# Patient Record
Sex: Female | Born: 1965 | Race: White | Hispanic: No | Marital: Married | Smoking: Heavy tobacco smoker
Health system: Southern US, Community
[De-identification: ages and names within clinical notes are randomized; demographics above are authoritative.]

## PROBLEM LIST (undated history)

## (undated) HISTORY — PX: TUBAL LIGATION: SHX77

## (undated) HISTORY — PX: KNEE SURGERY: SHX244

---

## 2002-04-13 ENCOUNTER — Ambulatory Visit (HOSPITAL_COMMUNITY): Admission: RE | Admit: 2002-04-13 | Discharge: 2002-04-13 | Payer: Self-pay | Admitting: Family Medicine

## 2002-04-13 ENCOUNTER — Encounter: Payer: Self-pay | Admitting: Family Medicine

## 2003-12-04 ENCOUNTER — Emergency Department (HOSPITAL_COMMUNITY): Admission: EM | Admit: 2003-12-04 | Discharge: 2003-12-04 | Payer: Self-pay | Admitting: Emergency Medicine

## 2004-01-14 ENCOUNTER — Ambulatory Visit (HOSPITAL_COMMUNITY): Admission: RE | Admit: 2004-01-14 | Discharge: 2004-01-14 | Payer: Self-pay | Admitting: Family Medicine

## 2004-01-18 ENCOUNTER — Ambulatory Visit (HOSPITAL_COMMUNITY): Admission: RE | Admit: 2004-01-18 | Discharge: 2004-01-18 | Payer: Self-pay | Admitting: Family Medicine

## 2004-02-09 ENCOUNTER — Encounter (HOSPITAL_COMMUNITY): Admission: RE | Admit: 2004-02-09 | Discharge: 2004-03-10 | Payer: Self-pay | Admitting: Neurosurgery

## 2004-03-15 ENCOUNTER — Ambulatory Visit (HOSPITAL_COMMUNITY): Admission: RE | Admit: 2004-03-15 | Discharge: 2004-03-15 | Payer: Self-pay | Admitting: Neurosurgery

## 2004-05-03 ENCOUNTER — Ambulatory Visit (HOSPITAL_COMMUNITY): Admission: RE | Admit: 2004-05-03 | Discharge: 2004-05-03 | Payer: Self-pay | Admitting: Family Medicine

## 2004-08-14 ENCOUNTER — Emergency Department (HOSPITAL_COMMUNITY): Admission: EM | Admit: 2004-08-14 | Discharge: 2004-08-14 | Payer: Self-pay | Admitting: Emergency Medicine

## 2004-11-20 ENCOUNTER — Ambulatory Visit (HOSPITAL_COMMUNITY): Admission: RE | Admit: 2004-11-20 | Discharge: 2004-11-20 | Payer: Self-pay | Admitting: Family Medicine

## 2005-02-07 ENCOUNTER — Ambulatory Visit (HOSPITAL_COMMUNITY): Admission: RE | Admit: 2005-02-07 | Discharge: 2005-02-07 | Payer: Self-pay | Admitting: Obstetrics and Gynecology

## 2005-04-20 ENCOUNTER — Emergency Department (HOSPITAL_COMMUNITY): Admission: EM | Admit: 2005-04-20 | Discharge: 2005-04-20 | Payer: Self-pay | Admitting: Emergency Medicine

## 2005-07-18 ENCOUNTER — Ambulatory Visit (HOSPITAL_COMMUNITY): Admission: RE | Admit: 2005-07-18 | Discharge: 2005-07-18 | Payer: Self-pay | Admitting: Family Medicine

## 2007-03-04 ENCOUNTER — Ambulatory Visit (HOSPITAL_COMMUNITY): Admission: RE | Admit: 2007-03-04 | Discharge: 2007-03-04 | Payer: Self-pay | Admitting: Family Medicine

## 2007-03-12 ENCOUNTER — Ambulatory Visit (HOSPITAL_COMMUNITY): Admission: RE | Admit: 2007-03-12 | Discharge: 2007-03-12 | Payer: Self-pay | Admitting: Family Medicine

## 2008-04-21 ENCOUNTER — Ambulatory Visit (HOSPITAL_COMMUNITY): Admission: RE | Admit: 2008-04-21 | Discharge: 2008-04-21 | Payer: Self-pay | Admitting: Family Medicine

## 2018-11-28 ENCOUNTER — Emergency Department (HOSPITAL_COMMUNITY)
Admission: EM | Admit: 2018-11-28 | Discharge: 2018-11-28 | Disposition: A | Discharge status: Home / Self Care | Payer: Worker's Compensation | Attending: Emergency Medicine | Admitting: Emergency Medicine

## 2018-11-28 ENCOUNTER — Other Ambulatory Visit: Payer: Self-pay

## 2018-11-28 ENCOUNTER — Emergency Department (HOSPITAL_COMMUNITY): Payer: Worker's Compensation

## 2018-11-28 ENCOUNTER — Encounter (HOSPITAL_COMMUNITY): Payer: Self-pay | Admitting: Emergency Medicine

## 2018-11-28 DIAGNOSIS — Y93G9 Activity, other involving cooking and grilling: Secondary | ICD-10-CM | POA: Insufficient documentation

## 2018-11-28 DIAGNOSIS — Y990 Civilian activity done for income or pay: Secondary | ICD-10-CM | POA: Diagnosis not present

## 2018-11-28 DIAGNOSIS — Y929 Unspecified place or not applicable: Secondary | ICD-10-CM | POA: Diagnosis not present

## 2018-11-28 DIAGNOSIS — Z23 Encounter for immunization: Secondary | ICD-10-CM | POA: Diagnosis not present

## 2018-11-28 DIAGNOSIS — S61215A Laceration without foreign body of left ring finger without damage to nail, initial encounter: Secondary | ICD-10-CM | POA: Insufficient documentation

## 2018-11-28 DIAGNOSIS — F17210 Nicotine dependence, cigarettes, uncomplicated: Secondary | ICD-10-CM | POA: Insufficient documentation

## 2018-11-28 DIAGNOSIS — S61212A Laceration without foreign body of right middle finger without damage to nail, initial encounter: Secondary | ICD-10-CM

## 2018-11-28 DIAGNOSIS — W268XXA Contact with other sharp object(s), not elsewhere classified, initial encounter: Secondary | ICD-10-CM | POA: Diagnosis not present

## 2018-11-28 MED ORDER — CEPHALEXIN 500 MG PO CAPS: 500.0000 mg | ORAL_CAPSULE | Freq: Four times a day (QID) | ORAL | 0 refills | Status: AC

## 2018-11-28 MED ORDER — LIDOCAINE HCL (PF) 1 % IJ SOLN
5.0000 mL | Freq: Once | INTRAMUSCULAR | Status: AC
  Administered 2018-11-28: 5 mL via INTRADERMAL
  Filled 2018-11-28: qty 6

## 2018-11-28 MED ORDER — TETANUS-DIPHTH-ACELL PERTUSSIS 5-2.5-18.5 LF-MCG/0.5 IM SUSP
0.5000 mL | Freq: Once | INTRAMUSCULAR | Status: AC
  Administered 2018-11-28: 0.5 mL via INTRAMUSCULAR
  Filled 2018-11-28: qty 0.5

## 2018-11-28 NOTE — Discharge Instructions (Signed)
You have been diagnosed today with laceration of the right middle and left ring finger.  At this time there does not appear to be the presence of an emergent medical condition, however there is always the potential for conditions to change. Please read and follow the below instructions.  Please return to the Emergency Department immediately for any new or worsening symptoms. Please be sure to follow up with your Primary Care Provider in 1 week regarding your visit today; please call their office to schedule an appointment even if you are feeling better for a follow-up visit. Due to the placement of your lacerations you must have a recheck of your wounds in 3 days.  You may return to the emergency department or go to your primary care provider for a wound recheck.  Return to the emergency department sooner if you see signs of infection including redness, swelling, increased pain, pain with movement of the joint.  Please take the antibiotic Keflex as prescribed to avoid infection. Your sutures will need to be removed in 7 days.  You may have this done at the primary care doctor or here at the emergency department.   Return to the emergency department immediately if: You have a fever. You have chills. You have drainage, redness, swelling, or pain at your wound. There is a bad smell coming from your wound. The skin edges of your wound start to separate after your sutures have been removed. Your wound becomes thick, raised, and darker in color after your sutures come out (scarring).  Please read the additional information packets attached to your discharge summary.  Do not take your medicine if  develop an itchy rash, swelling in your mouth or lips, or difficulty breathing.

## 2018-11-28 NOTE — ED Provider Notes (Signed)
[facility] EMERGENCY DEPARTMENT Provider Note   CSN: [id number] Arrival date & time: 11/28/18  1113     History   Chief Complaint Chief Complaint  Patient presents with  . Laceration    HPI Madison Gregory is a 52 y.o. female presents today for laceration occurred just prior to arrival.  Patient states that she was at work attempting to pull a mixer out of the freezer when her hand slipped cutting herself on the blade.  Patient with lacerations to bilateral hands, right middle finger, left middle and fourth finger.  Patient states that she had a moderate intensity, sharp pain to the fingers that is worsened with movement and palpation.  Patient states that pain has somewhat subsided since onset.  Patient has not taken anything for pain prior to arrival.  Patient states that her last tetanus shot was greater than 5 years ago however is unsure of timing, requesting booster.  No other complaints.  HPI  History reviewed. No pertinent past medical history.  There are no active problems to display for this patient.   Past Surgical History:  Procedure Laterality Date  . KNEE SURGERY    . TUBAL LIGATION       OB History   None      Home Medications    Prior to Admission medications   Medication Sig Start Date End Date Taking? Authorizing Provider  cephALEXin (KEFLEX) 500 MG capsule Take 1 capsule (500 mg total) by mouth 4 (four) times daily for 7 days. 11/28/18 12/05/18  Bill Salinas, PA-C    Family History History reviewed. No pertinent family history.  Social History Social History   Tobacco Use  . Smoking status: Heavy Tobacco Smoker    Packs/day: 0.50    Types: Cigarettes  . Smokeless tobacco: Never Used  Substance Use Topics  . Alcohol use: Never    Frequency: Never  . Drug use: Never     Allergies   Patient has no allergy information on record.   Review of Systems Review of Systems  Constitutional: Negative.  Negative for chills and fever.    Skin: Positive for wound (Laceration).  Neurological: Negative.  Negative for weakness and numbness.   Physical Exam Updated Vital Signs BP 128/70 (BP Location: Right Arm)   Pulse 63   Temp 98.5 F (36.9 C)   Resp 16   Ht 5\' 1"  (1.549 m)   Wt 63.5 kg   SpO2 100%   BMI 26.45 kg/m   Physical Exam  Constitutional: She is oriented to person, place, and time. She appears well-developed and well-nourished. No distress.  HENT:  Head: Normocephalic and atraumatic.  Right Ear: External ear normal.  Left Ear: External ear normal.  Nose: Nose normal.  Eyes: Pupils are equal, round, and reactive to light. EOM are normal.  Neck: Trachea normal and normal range of motion. Neck supple. No tracheal deviation present.  Cardiovascular: Normal rate, regular rhythm, normal heart sounds and intact distal pulses.  Pulses:      Radial pulses are 2+ on the right side, and 2+ on the left side.  Pulmonary/Chest: Effort normal. No respiratory distress.  Abdominal: Soft. There is no tenderness. There is no rebound and no guarding.  Musculoskeletal: Normal range of motion.  Right hand: Laceration to the right long finger at the palmar side of the middle phalanx.  2 cm in length, some adipose tissue present without tendon injury, nerve injury or l major vascular involvement hand thoroughly inspected, all  other small lacerations present are very superficial and do not warrant suturing. Tenderness to palpation directly over laceration.  No snuffbox tenderness to palpation. No tenderness to palpation over flexor sheath.  Finger adduction/abduction intact with 5/5 strength with some increased pain.  Thumb opposition intact. Full active and resisted ROM to flexion/extension at wrist, MCP, PIP and DIP of all fingers.  FDS/FDP intact. Grip 5/5 strength.  Radial artery 2+ with <2sec cap refill in all fingers.  Sensation intact to light-tough in median/ulnar/radial  distributions.  ---------------------------  Left hand: Laceration present to the medial/palmar side of the left fourth finger along proximal joint line and proximal phalanx.  Thoroughly inspected and is without joint capsule, tendon, nerve or major vessel involvement.  The rest of the hand was thoroughly inspected and all other abrasions are very superficial and do not warrant suturing. Tenderness to palpation over directly over the laceration.  No snuffbox tenderness to palpation. No tenderness to palpation over flexor sheath.  Finger adduction/abduction intact with 5/5 strength.  Thumb opposition intact. Full active and resisted ROM to flexion/extension at wrist, MCP, PIP and DIP of all fingers.  FDS/FDP intact. Grip 5/5 strength.  Radial artery 2+ with <2sec cap refill in all fingers.  Sensation intact to light-tough in median/ulnar/radial distributions.  Neurological: She is alert and oriented to person, place, and time.  Skin: Skin is warm and dry. Capillary refill takes less than 2 seconds.  Psychiatric: She has a normal mood and affect. Her behavior is normal.           ED Treatments / Results  Labs (all labs ordered are listed, but only abnormal results are displayed) Labs Reviewed - No data to display  EKG None  Radiology Dg Hand Complete Left  Result Date: 11/28/2018 CLINICAL DATA:  Bilateral ring and left middle finger lacerations. EXAM: RIGHT HAND - COMPLETE 3+ VIEW; LEFT HAND - COMPLETE 3+ VIEW COMPARISON:  Right hand x-rays dated November 20, 2004. FINDINGS: Left hand: No acute fracture or dislocation. Mild third through fifth DIP joint space narrowing. Bone mineralization is normal. Soft tissues are unremarkable. No radiopaque foreign body identified. Right hand: No acute fracture or dislocation. Mild third through fifth DIP joint space narrowing. Bone mineralization is normal. Soft tissues are unremarkable. No radiopaque foreign body identified. IMPRESSION: 1. No  acute osseous abnormality.  No radiopaque foreign body. 2. 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 Electronically Signed   By: Obie Dredge M.D.   On: 11/28/2018 12:05   Dg Hand Complete Right  Result Date: 11/28/2018 CLINICAL DATA:  Bilateral ring and left middle finger lacerations. EXAM: RIGHT HAND - COMPLETE 3+ VIEW; LEFT HAND - COMPLETE 3+ VIEW COMPARISON:  Right hand x-rays dated November 20, 2004. FINDINGS: Left hand: No acute fracture or dislocation. Mild third through fifth DIP joint space narrowing. Bone mineralization is normal. Soft tissues are unremarkable. No radiopaque foreign body identified. Right hand: No acute fracture or dislocation. Mild third through fifth DIP joint space narrowing. Bone mineralization is normal. Soft tissues are unremarkable. No radiopaque foreign body identified. IMPRESSION: 1. No acute osseous abnormality.  No radiopaque foreign body. 2. 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 Electronically Signed   By: Obie Dredge M.D.   On: 11/28/2018 12:05    Procedures .Marland KitchenLaceration Repair Date/Time: 11/28/2018 1:41 PM Performed by: Bill Salinas, PA-C Authorized by: Bill Salinas, PA-C   Consent:    Consent obtained:  Verbal  Consent given by:  Patient   Risks discussed:  Infection, pain, retained foreign body, tendon damage, vascular damage, poor wound healing, poor cosmetic result, need for additional repair and nerve damage Anesthesia (see MAR for exact dosages):    Anesthesia method:  Local infiltration   Local anesthetic:  Lidocaine 1% w/o epi Laceration details:    Location:  Finger   Finger location:  L ring finger   Length (cm):  2   Depth (mm):  3 Repair type:    Repair type:  Simple Pre-procedure details:    Preparation:  Patient was prepped and draped in usual sterile fashion and imaging obtained to evaluate for foreign bodies Exploration:    Hemostasis achieved with:   Direct pressure   Wound exploration: wound explored through full range of motion and entire depth of wound probed and visualized     Wound extent: no foreign bodies/material noted, no muscle damage noted, no nerve damage noted, no tendon damage noted, no underlying fracture noted and no vascular damage noted   Treatment:    Area cleansed with:  Betadine   Amount of cleaning:  Extensive   Irrigation solution:  Sterile saline   Irrigation volume:  1 L   Irrigation method:  Pressure wash Skin repair:    Repair method:  Sutures   Suture size:  4-0   Suture material:  Prolene   Suture technique:  Simple interrupted   Number of sutures:  3 Approximation:    Approximation:  Close Post-procedure details:    Dressing:  Antibiotic ointment and non-adherent dressing   Patient tolerance of procedure:  Tolerated well, no immediate complications .Marland KitchenLaceration Repair Date/Time: 11/28/2018 1:42 PM Performed by: Bill Salinas, PA-C Authorized by: Bill Salinas, PA-C   Consent:    Consent obtained:  Verbal   Consent given by:  Patient   Risks discussed:  Infection, pain, retained foreign body, tendon damage, vascular damage, poor wound healing, poor cosmetic result, need for additional repair and nerve damage Anesthesia (see MAR for exact dosages):    Anesthesia method:  Local infiltration   Local anesthetic:  Lidocaine 1% w/o epi Laceration details:    Location:  Finger   Finger location:  R long finger   Length (cm):  2   Depth (mm):  3 Repair type:    Repair type:  Simple Pre-procedure details:    Preparation:  Patient was prepped and draped in usual sterile fashion and imaging obtained to evaluate for foreign bodies Exploration:    Hemostasis achieved with:  Direct pressure   Wound exploration: wound explored through full range of motion and entire depth of wound probed and visualized     Wound extent: no foreign bodies/material noted, no muscle damage noted, no nerve damage  noted, no tendon damage noted, no underlying fracture noted and no vascular damage noted     Contaminated: no   Treatment:    Area cleansed with:  Betadine and saline   Amount of cleaning:  Extensive   Irrigation solution:  Sterile saline   Irrigation volume:  1 L   Irrigation method:  Pressure wash Skin repair:    Repair method:  Sutures   Suture size:  4-0   Suture material:  Prolene   Suture technique:  Simple interrupted   Number of sutures:  3 Approximation:    Approximation:  Close Post-procedure details:    Dressing:  Antibiotic ointment and non-adherent dressing   Patient tolerance of procedure:  Tolerated well, no  immediate complications   (including critical care time)  Medications Ordered in ED Medications  Tdap (BOOSTRIX) injection 0.5 mL (0.5 mLs Intramuscular Given 11/28/18 1200)  lidocaine (PF) (XYLOCAINE) 1 % injection 5 mL (5 mLs Intradermal Given 11/28/18 1253)     Initial Impression / Assessment and Plan / ED Course  I have reviewed the triage vital signs and the nursing notes.  Pertinent labs & imaging results that were available during my care of the patient were reviewed by me and considered in my medical decision making (see chart for details).    Madison Gregory is a 52 y.o. female who presents to ED for laceration of right middle and left fourth fingers. Wound thoroughly cleaned in ED today. Wound explored and bottom of wound seen in a bloodless field, no tendon, nerve, joint or major vessel involvement.  Imaging today without foreign body or osseous involvement.  Lacerations repaired as dictated above. Patient counseled on home wound care.  Due to laceration location and that patient does not have a reliable outpatient PCP follow-up I have requested that the patient return to the emergency department in 72 hours for wound recheck.  Additionally I have prescribed patient Keflex for prophylaxis.  Tdap has been updated today. Patient was urged to return to the  Emergency Department for worsening pain, inability to move joint without pain, swelling, expanding erythema especially if it streaks away from the affected area, fever, or for any additional concerns. Patient verbalized understanding of return precautions. All questions answered.  Patient also informed that sutures will need to be removed in 1 week here at the emergency department.  At this time there does not appear to be any evidence of an acute emergency medical condition and the patient appears stable for discharge with appropriate outpatient follow up. Diagnosis was discussed with patient who verbalizes understanding of care plan and is agreeable to discharge. I have discussed return precautions with patient and son at bedside who verbalize understanding of return precautions. Patient strongly encouraged to return for wound check in 72 hours or sooner if new or worsening symptoms. All questions answered.  Patient was seen and evaluated by Dr. Adriana Simasook during this visit who agrees with work-up, laceration repair and follow-up.  Note: Portions of this report may have been transcribed using voice recognition software. Every effort was made to ensure accuracy; however, inadvertent computerized transcription errors may still be present. Final Clinical Impressions(s) / ED Diagnoses   Final diagnoses:  Laceration of right middle finger without foreign body without damage to nail, initial encounter  Laceration of left ring finger without foreign body without damage to nail, initial encounter    ED Discharge Orders         Ordered    cephALEXin (KEFLEX) 500 MG capsule  4 times daily     11/28/18 1402           Elizabeth PalauMorelli, Asiah Befort A, PA-C 11/28/18 1726    Donnetta Hutchingook, Brian, MD 12/01/18 1752

## 2018-11-28 NOTE — ED Triage Notes (Signed)
Pt reports was moving a mixer and freezer while at work. Laceration noted to bilateral ring finger and left middle finger. Minimal bleeding noted.

## 2018-12-02 ENCOUNTER — Other Ambulatory Visit: Payer: Self-pay

## 2018-12-02 ENCOUNTER — Emergency Department (HOSPITAL_COMMUNITY)
Admission: EM | Admit: 2018-12-02 | Discharge: 2018-12-02 | Disposition: A | Discharge status: Home / Self Care | Payer: Worker's Compensation | Attending: Emergency Medicine | Admitting: Emergency Medicine

## 2018-12-02 ENCOUNTER — Encounter (HOSPITAL_COMMUNITY): Payer: Self-pay | Admitting: Emergency Medicine

## 2018-12-02 DIAGNOSIS — Z5189 Encounter for other specified aftercare: Secondary | ICD-10-CM

## 2018-12-02 DIAGNOSIS — Z4801 Encounter for change or removal of surgical wound dressing: Secondary | ICD-10-CM | POA: Diagnosis present

## 2018-12-02 NOTE — ED Notes (Signed)
Patient given discharge instruction, verbalized understand. IV removed, band aid applied. Patient ambulatory out of the department.  

## 2018-12-02 NOTE — ED Provider Notes (Signed)
[facility] EMERGENCY DEPARTMENT Provider Note   CSN: [id number] Arrival date & time: 12/02/18  1411     History   Chief Complaint Chief Complaint  Patient presents with  . Wound Check    HPI Madison Gregory is a 52 y.o. female presenting for evaluation of sutured lacerations on her right long finger and her left long and ring fingers from injuries sustained on a mixer at work 4 days ago.  She denies increased pain, swelling, drainage or other problems with these wounds.  She was advised to return today for a recheck of her injuries.  She does endorse intermittent tingling sensation of right long finger distal to the laceration site. She is on keflex for prophylaxis and tolerating well..  The history is provided by the patient.    History reviewed. No pertinent past medical history.  There are no active problems to display for this patient.   Past Surgical History:  Procedure Laterality Date  . KNEE SURGERY    . TUBAL LIGATION       OB History    Gravida      Para      Term      Preterm      AB      Living  2     SAB      TAB      Ectopic      Multiple      Live Births               Home Medications    Prior to Admission medications   Medication Sig Start Date End Date Taking? Authorizing Provider  cephALEXin (KEFLEX) 500 MG capsule Take 1 capsule (500 mg total) by mouth 4 (four) times daily for 7 days. 11/28/18 12/05/18  Bill SalinasMorelli, Brandon A, PA-C    Family History History reviewed. No pertinent family history.  Social History Social History   Tobacco Use  . Smoking status: Heavy Tobacco Smoker    Packs/day: 1.00    Types: Cigarettes  . Smokeless tobacco: Never Used  Substance Use Topics  . Alcohol use: Never    Frequency: Never  . Drug use: Never     Allergies   Patient has no known allergies.   Review of Systems Review of Systems  Constitutional: Negative for chills and fever.  Musculoskeletal: Negative for arthralgias.    Skin: Positive for wound.  Neurological: Negative for weakness.  All other systems reviewed and are negative.    Physical Exam Updated Vital Signs BP 131/79 (BP Location: Right Arm)   Pulse 87   Temp 98.3 F (36.8 C) (Oral)   Resp 16   Ht 5\' 1"  (1.549 m)   Wt 63.5 kg   SpO2 96%   BMI 26.45 kg/m   Physical Exam  Constitutional: She is oriented to person, place, and time. She appears well-developed and well-nourished.  HENT:  Head: Normocephalic.  Cardiovascular: Normal rate.  Pulmonary/Chest: Effort normal.  Musculoskeletal: Normal range of motion. She exhibits no tenderness.  Neurological: She is alert and oriented to person, place, and time. A sensory deficit is present.  Pt has sensation right ring finger distally, but tingly in character.  Less than 2 sec cap refill.  Skin: Laceration noted.  Well healing lacerations, no erythema, no drainage or red streaking.  Mild edema of fingers.      ED Treatments / Results  Labs (all labs ordered are listed, but only abnormal results are displayed) Labs Reviewed -  No data to display  EKG None  Radiology No results found.  Procedures Procedures (including critical care time)  Medications Ordered in ED Medications - No data to display   Initial Impression / Assessment and Plan / ED Course  I have reviewed the triage vital signs and the nursing notes.  Pertinent labs & imaging results that were available during my care of the patient were reviewed by me and considered in my medical decision making (see chart for details).     Well appearing wounds healing as expected.  Advised suture removal in 5 days. Discussed tingle sensation and could be from edema, will likely resolve as wound continues to heal.  Final Clinical Impressions(s) / ED Diagnoses   Final diagnoses:  Visit for wound check    ED Discharge Orders    None       Victoriano Lain 12/02/18 2111    Samuel Jester, DO 12/06/18 [phone]

## 2018-12-02 NOTE — ED Triage Notes (Signed)
PT had laceration repair to bilateral ring fingers and left middle finger repaired on 11/28/18 and states she was told to come to ED for wound check today.

## 2018-12-02 NOTE — Discharge Instructions (Addendum)
Continue taking your antibiotics.  Your wounds appear to be healing well but as discussed, I doubt the left finger wound will be healed well enough for suture removal in 3 days. Instead, I recommend returning on Sunday to have your stitches removed.

## 2018-12-02 NOTE — ED Notes (Signed)
Dressing applied. 

## 2018-12-07 ENCOUNTER — Encounter (HOSPITAL_COMMUNITY): Payer: Self-pay | Admitting: Emergency Medicine

## 2018-12-07 ENCOUNTER — Emergency Department (HOSPITAL_COMMUNITY)
Admission: EM | Admit: 2018-12-07 | Discharge: 2018-12-07 | Disposition: A | Discharge status: Home / Self Care | Payer: Worker's Compensation | Attending: Emergency Medicine | Admitting: Emergency Medicine

## 2018-12-07 DIAGNOSIS — Z4802 Encounter for removal of sutures: Secondary | ICD-10-CM | POA: Insufficient documentation

## 2018-12-07 DIAGNOSIS — F17210 Nicotine dependence, cigarettes, uncomplicated: Secondary | ICD-10-CM | POA: Insufficient documentation

## 2018-12-07 NOTE — ED Provider Notes (Signed)
[facility] EMERGENCY DEPARTMENT Provider Note   CSN: [id number] Arrival date & time: 12/07/18  0741     History   Chief Complaint Chief Complaint  Patient presents with  . Suture / Staple Removal    HPI Madison Gregory is a 52 y.o. female.  The history is provided by the patient.  Suture / Staple Removal  The current episode started more than 1 week ago. The problem occurs constantly. The problem has been gradually improving. Pertinent negatives include no chest pain, no abdominal pain and no shortness of breath. Associated symptoms comments: Stiffness of fingers. Nothing aggravates the symptoms. The symptoms are relieved by acetaminophen. She has tried acetaminophen (cleansing wound with Q-tip) for the symptoms.    History reviewed. No pertinent past medical history.  There are no active problems to display for this patient.   Past Surgical History:  Procedure Laterality Date  . KNEE SURGERY    . TUBAL LIGATION       OB History    Gravida      Para      Term      Preterm      AB      Living  2     SAB      TAB      Ectopic      Multiple      Live Births               Home Medications    Prior to Admission medications   Not on File    Family History History reviewed. No pertinent family history.  Social History Social History   Tobacco Use  . Smoking status: Heavy Tobacco Smoker    Packs/day: 1.00    Types: Cigarettes  . Smokeless tobacco: Never Used  Substance Use Topics  . Alcohol use: Never    Frequency: Never  . Drug use: Never     Allergies   Patient has no known allergies.   Review of Systems Review of Systems  Constitutional: Negative for activity change.       All ROS Neg except as noted in HPI  HENT: Negative for nosebleeds.   Eyes: Negative for photophobia and discharge.  Respiratory: Negative for cough, shortness of breath and wheezing.   Cardiovascular: Negative for chest pain and palpitations.    Gastrointestinal: Negative for abdominal pain and blood in stool.  Genitourinary: Negative for dysuria, frequency and hematuria.  Musculoskeletal: Negative for arthralgias, back pain and neck pain.  Skin: Positive for wound.       Sutured wounds to the fingers  Neurological: Negative for dizziness, seizures and speech difficulty.  Psychiatric/Behavioral: Negative for confusion and hallucinations.     Physical Exam Updated Vital Signs BP 121/70 (BP Location: Right Arm)   Pulse 82   Temp 98 F (36.7 C) (Oral)   Resp 16   Ht 5\' 1"  (1.549 m)   Wt 63 kg   SpO2 96%   BMI 26.24 kg/m   Physical Exam  Constitutional: She is oriented to person, place, and time. She appears well-developed and well-nourished.  Non-toxic appearance.  HENT:  Head: Normocephalic.  Right Ear: Tympanic membrane and external ear normal.  Left Ear: Tympanic membrane and external ear normal.  Eyes: Pupils are equal, round, and reactive to light. EOM and lids are normal.  Neck: Normal range of motion. Neck supple. Carotid bruit is not present.  Cardiovascular: Normal rate, regular rhythm, normal heart sounds, intact distal pulses and  normal pulses.  Pulmonary/Chest: Breath sounds normal. No respiratory distress.  Abdominal: Soft. Bowel sounds are normal. There is no tenderness. There is no guarding.  Musculoskeletal: Normal range of motion.  Sutured laceration to the right middle finger, left middle finger and left ring finger are healing nicely.  Sutures are in place.  There is no drainage noted.  No red streaks noted.  Capillary refill is less than 2 seconds.  The patient has stiffness at the PIP joints of the middle finger and ring finger on the left and the middle finger on the right.  Lymphadenopathy:       Head (right side): No submandibular adenopathy present.       Head (left side): No submandibular adenopathy present.    She has no cervical adenopathy.  Neurological: She is alert and oriented to person,  place, and time. She has normal strength. No cranial nerve deficit or sensory deficit.  No sensory deficits noted of the right or left upper extremity.  Skin: Skin is warm and dry.  Psychiatric: She has a normal mood and affect. Her speech is normal.  Nursing note and vitals reviewed.    ED Treatments / Results  Labs (all labs ordered are listed, but only abnormal results are displayed) Labs Reviewed - No data to display  EKG None  Radiology No results found.  Procedures Procedures (including critical care time)  Medications Ordered in ED Medications - No data to display   Initial Impression / Assessment and Plan / ED Course  I have reviewed the triage vital signs and the nursing notes.  Pertinent labs & imaging results that were available during my care of the patient were reviewed by me and considered in my medical decision making (see chart for details).       Final Clinical Impressions(s) / ED Diagnoses MDM  Vital signs are within normal limits.  Pulse oximetry is 96% on room air.  Within normal limits by my interpretation.  The sutured wounds of the right and left hand are well-healed.  Sutures remain in place.  The patient has a great deal of stiffness and decreased motion at the PIP joints of the affected areas.  No sensory deficits appreciated.  Sutures were removed here in the emergency department.  Of asked the patient to see orthopedics for additional evaluation if the stiffness and difficulty with flexing the PIP joints continues.  The patient will return to the emergency department for additional evaluation if any signs of advancing infection.  Patient is in agreement with this plan.   Final diagnoses:  Visit for suture removal    ED Discharge Orders    None       Ivery Quale, PA-C 12/07/18 0831    Samuel Jester, DO 12/07/18 1020

## 2018-12-07 NOTE — Discharge Instructions (Signed)
Please cleanse the suture wounds daily with soap and water.  Continue to exercise your fingers.  Return to the emergency department for additional evaluation if any signs of advancing infection.

## 2018-12-07 NOTE — ED Triage Notes (Signed)
Pt here for suture removal from 11/29 from bilateral fingers.

## 2019-12-03 IMAGING — DX DG HAND COMPLETE 3+V*R*
3 series · 3 of 3 positions shown · non-contrast
Comparison: Right hand x-rays dated November 20, 2004.

CLINICAL DATA: Bilateral ring and left middle finger lacerations.

EXAM:
RIGHT HAND - COMPLETE 3+ VIEW; LEFT HAND - COMPLETE 3+ VIEW

[hand pa]
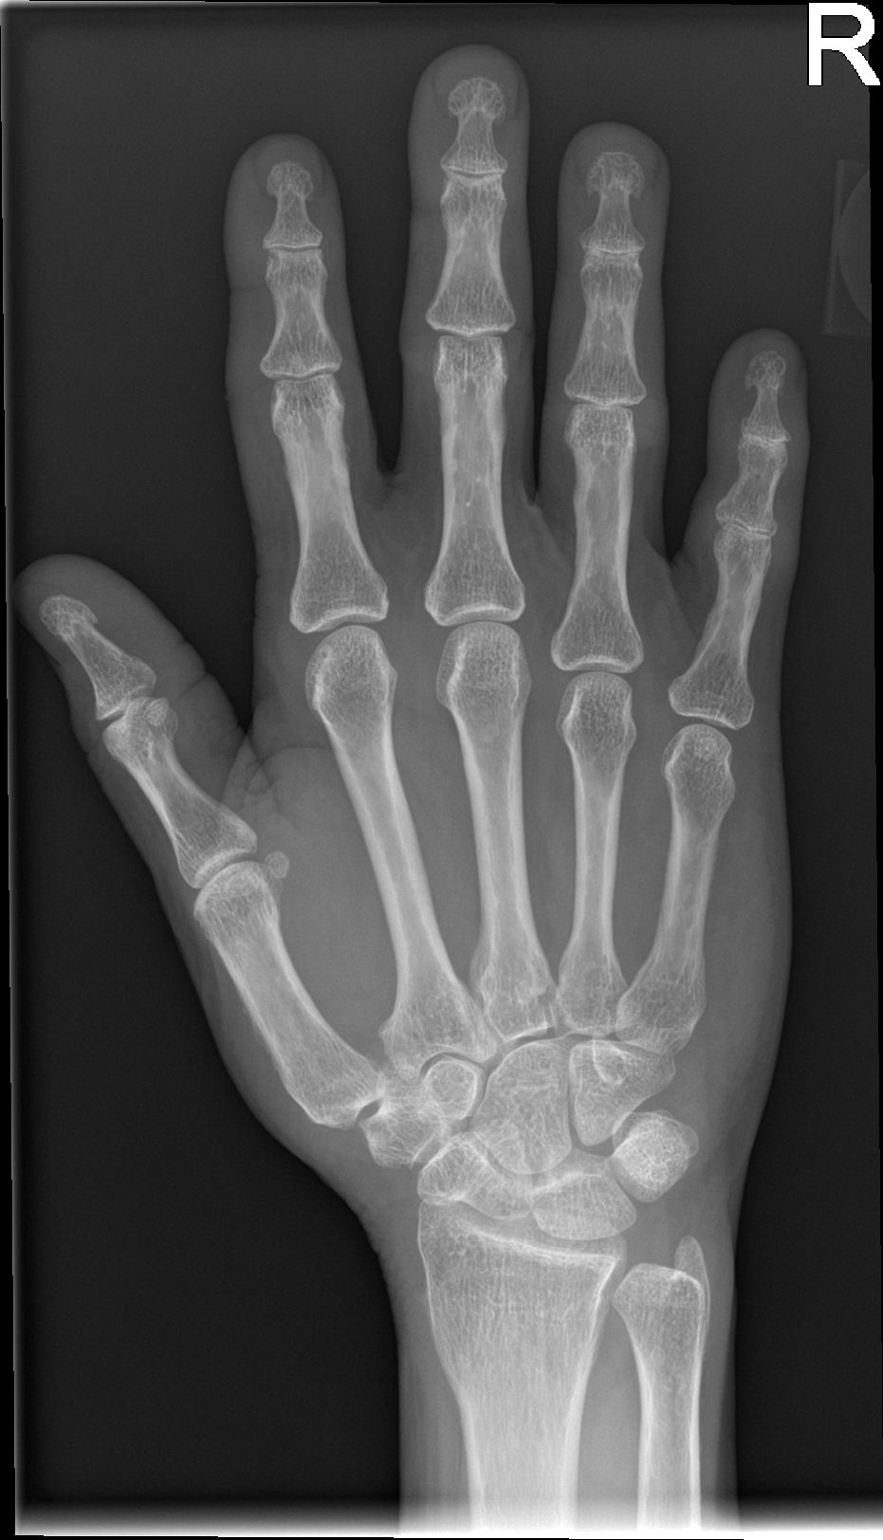

[hand obl]
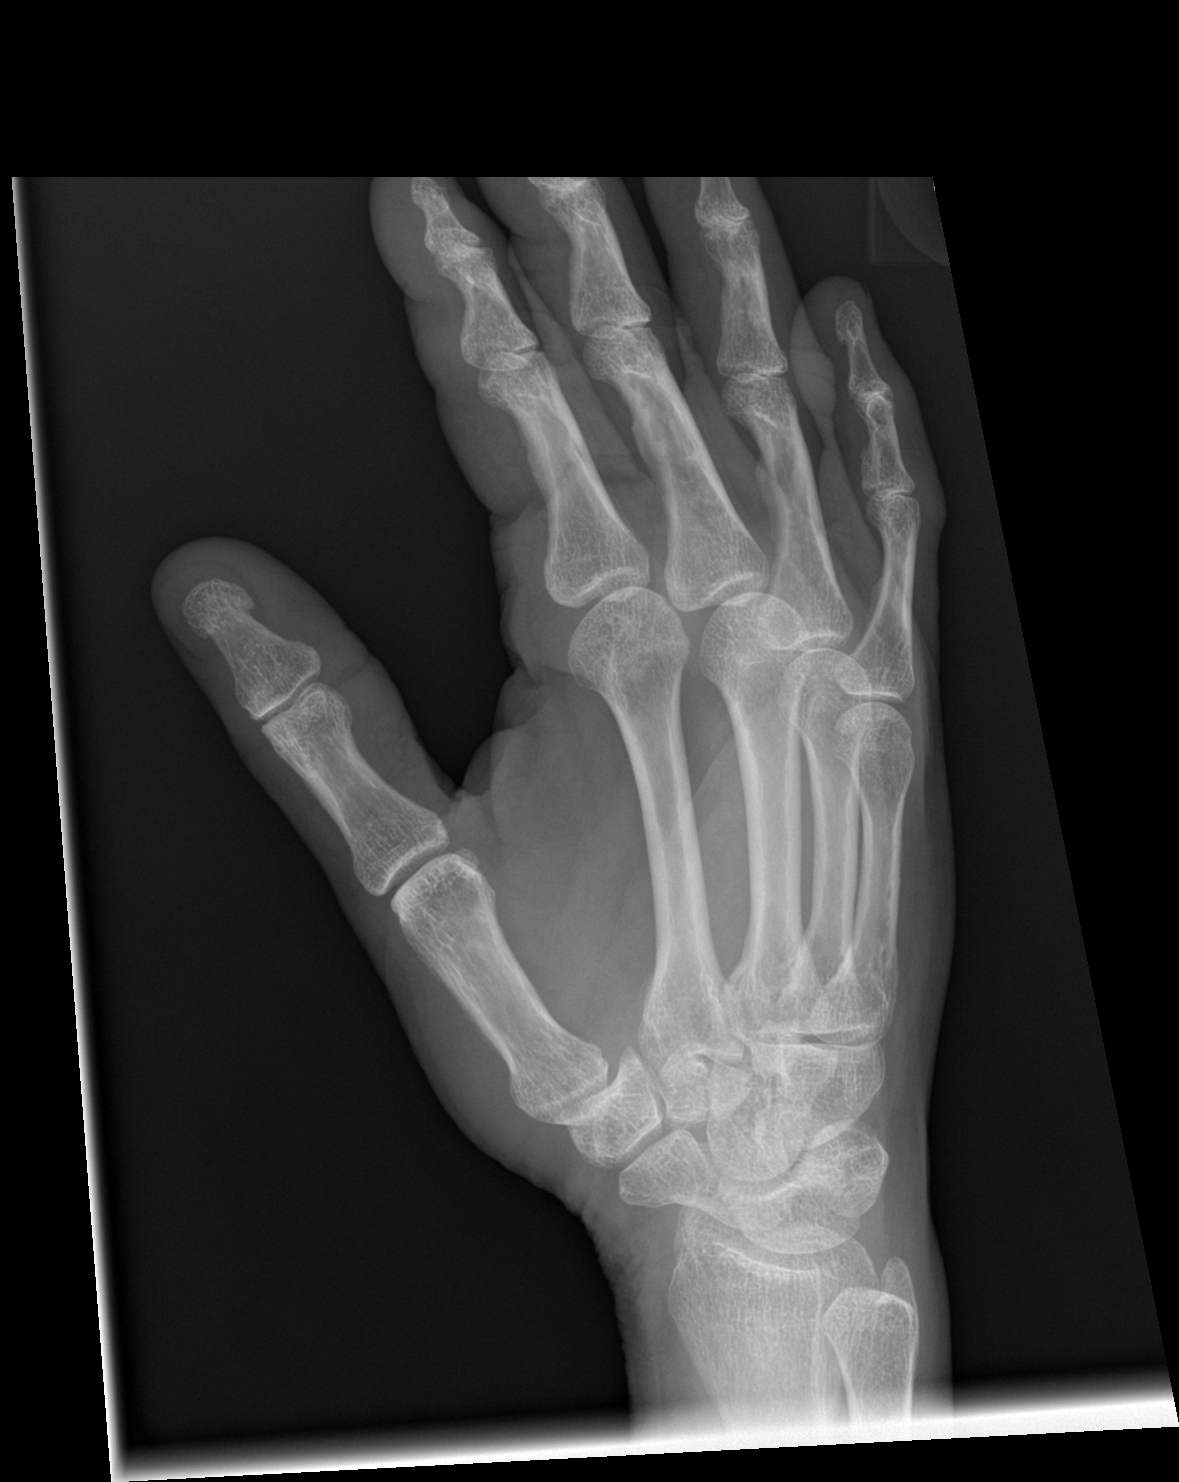

[hand lat]
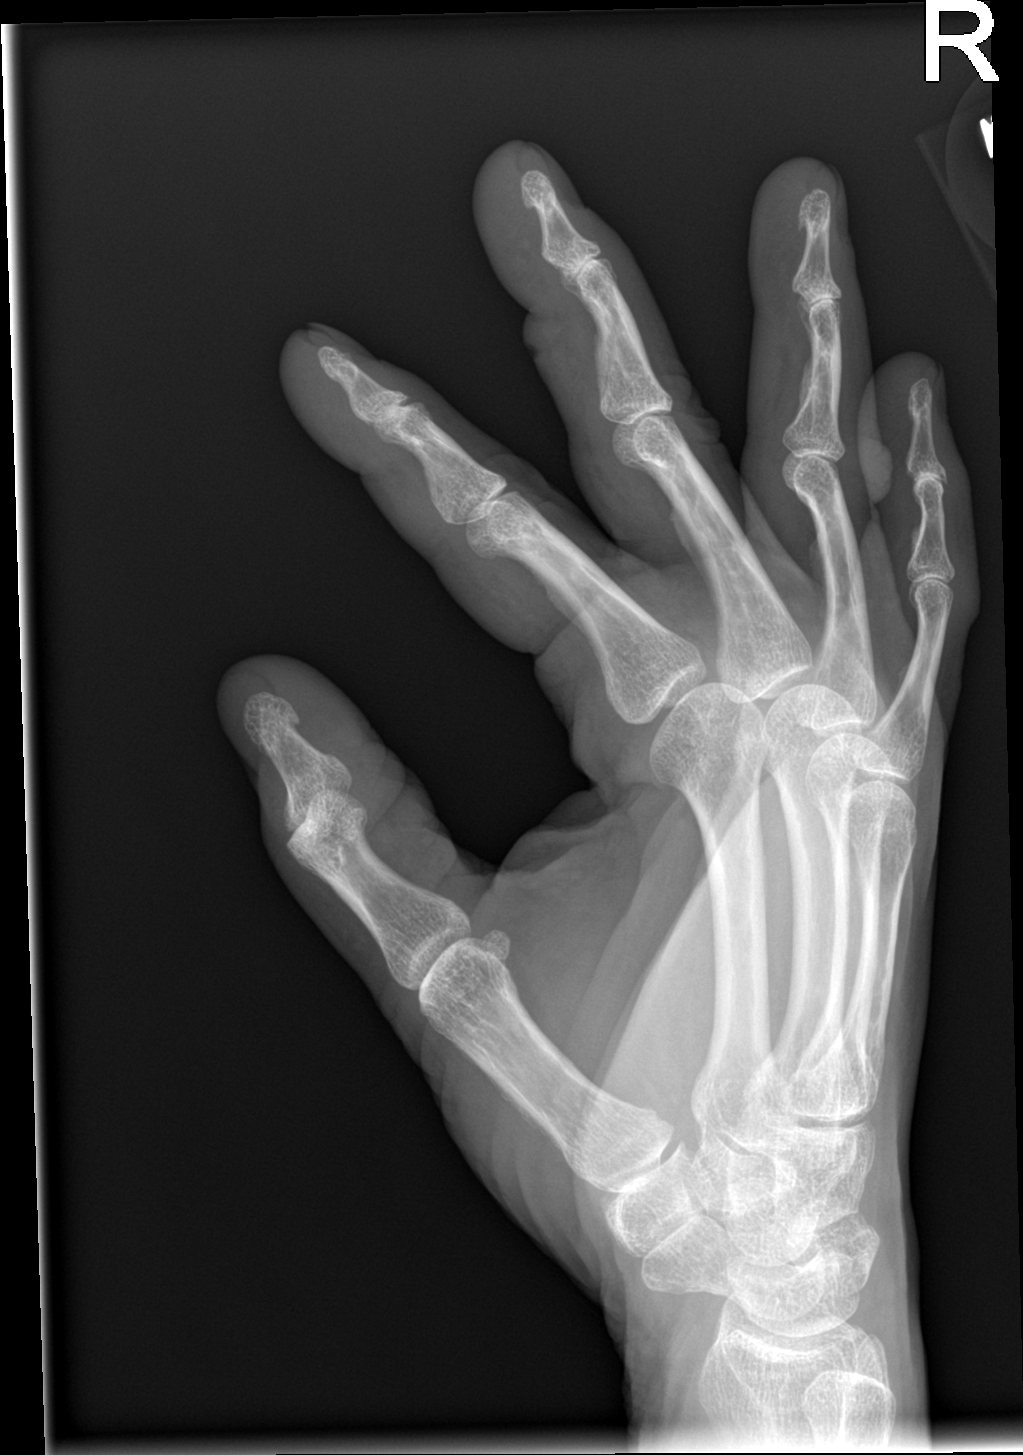

[3 of 3 positions shown; findings below may reference images not displayed]

FINDINGS: Left hand: No acute fracture or dislocation. Mild third through
fifth DIP joint space narrowing. Bone mineralization is normal. Soft
tissues are unremarkable. No radiopaque foreign body identified.

Right hand: No acute fracture or dislocation. Mild third through
fifth DIP joint space narrowing. Bone mineralization is normal. Soft
tissues are unremarkable. No radiopaque foreign body identified.
IMPRESSION: 1. No acute osseous abnormality.  No radiopaque foreign body.
2. Mild osteoarthritis of the bilateral third through fifth DIP
joints.

## 2019-12-03 IMAGING — DX DG HAND COMPLETE 3+V*L*
3 series · 3 of 3 positions shown · non-contrast
Comparison: Right hand x-rays dated November 20, 2004.

CLINICAL DATA: Bilateral ring and left middle finger lacerations.

EXAM:
RIGHT HAND - COMPLETE 3+ VIEW; LEFT HAND - COMPLETE 3+ VIEW

[hand pa]
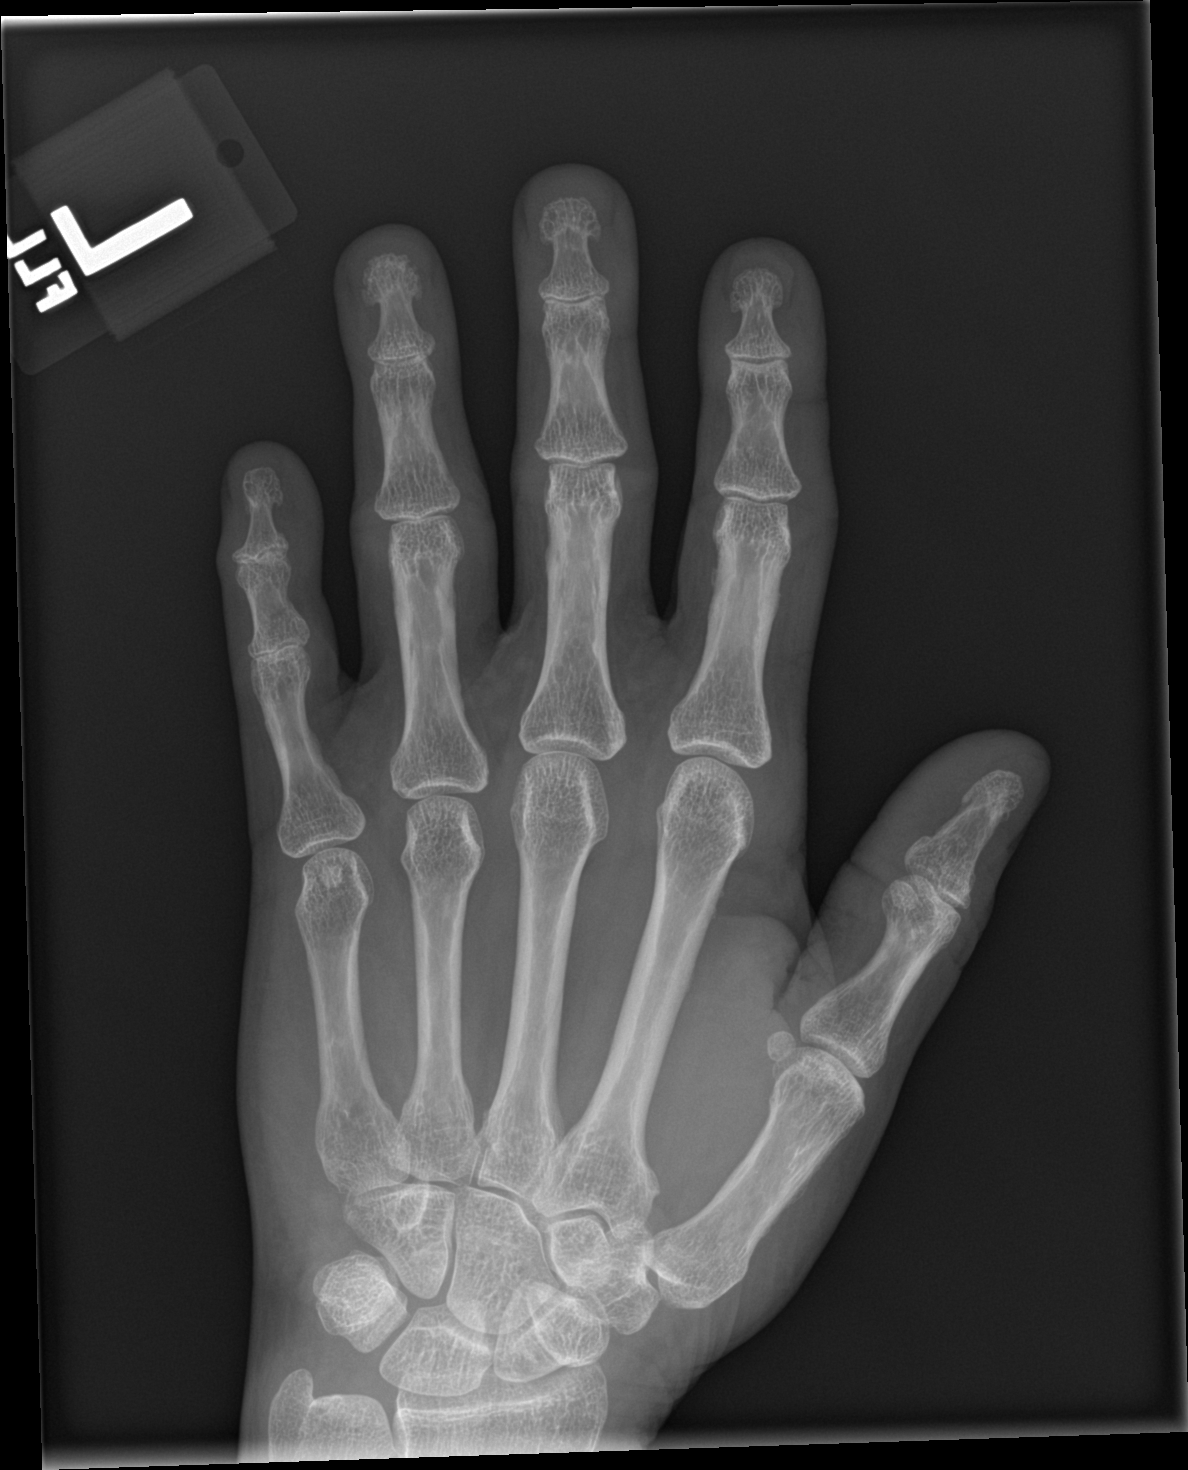

[hand obl]
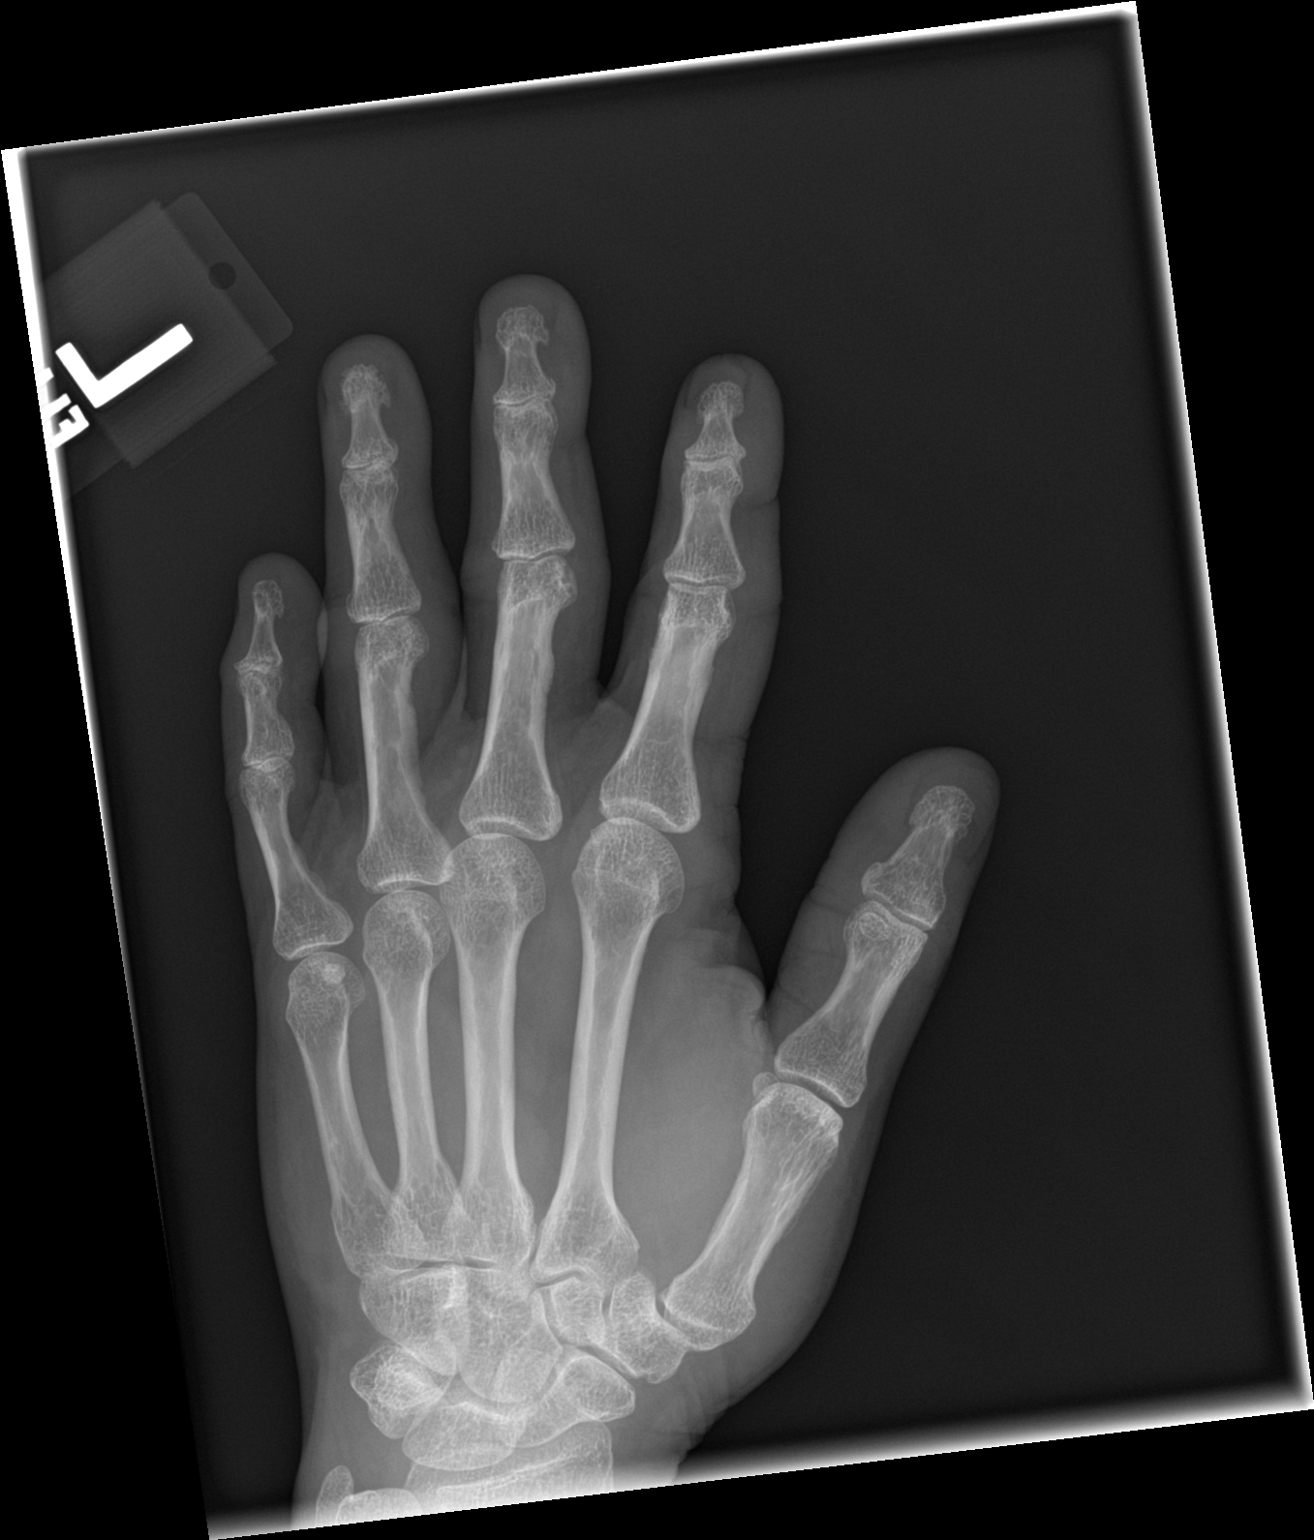

[hand lat]
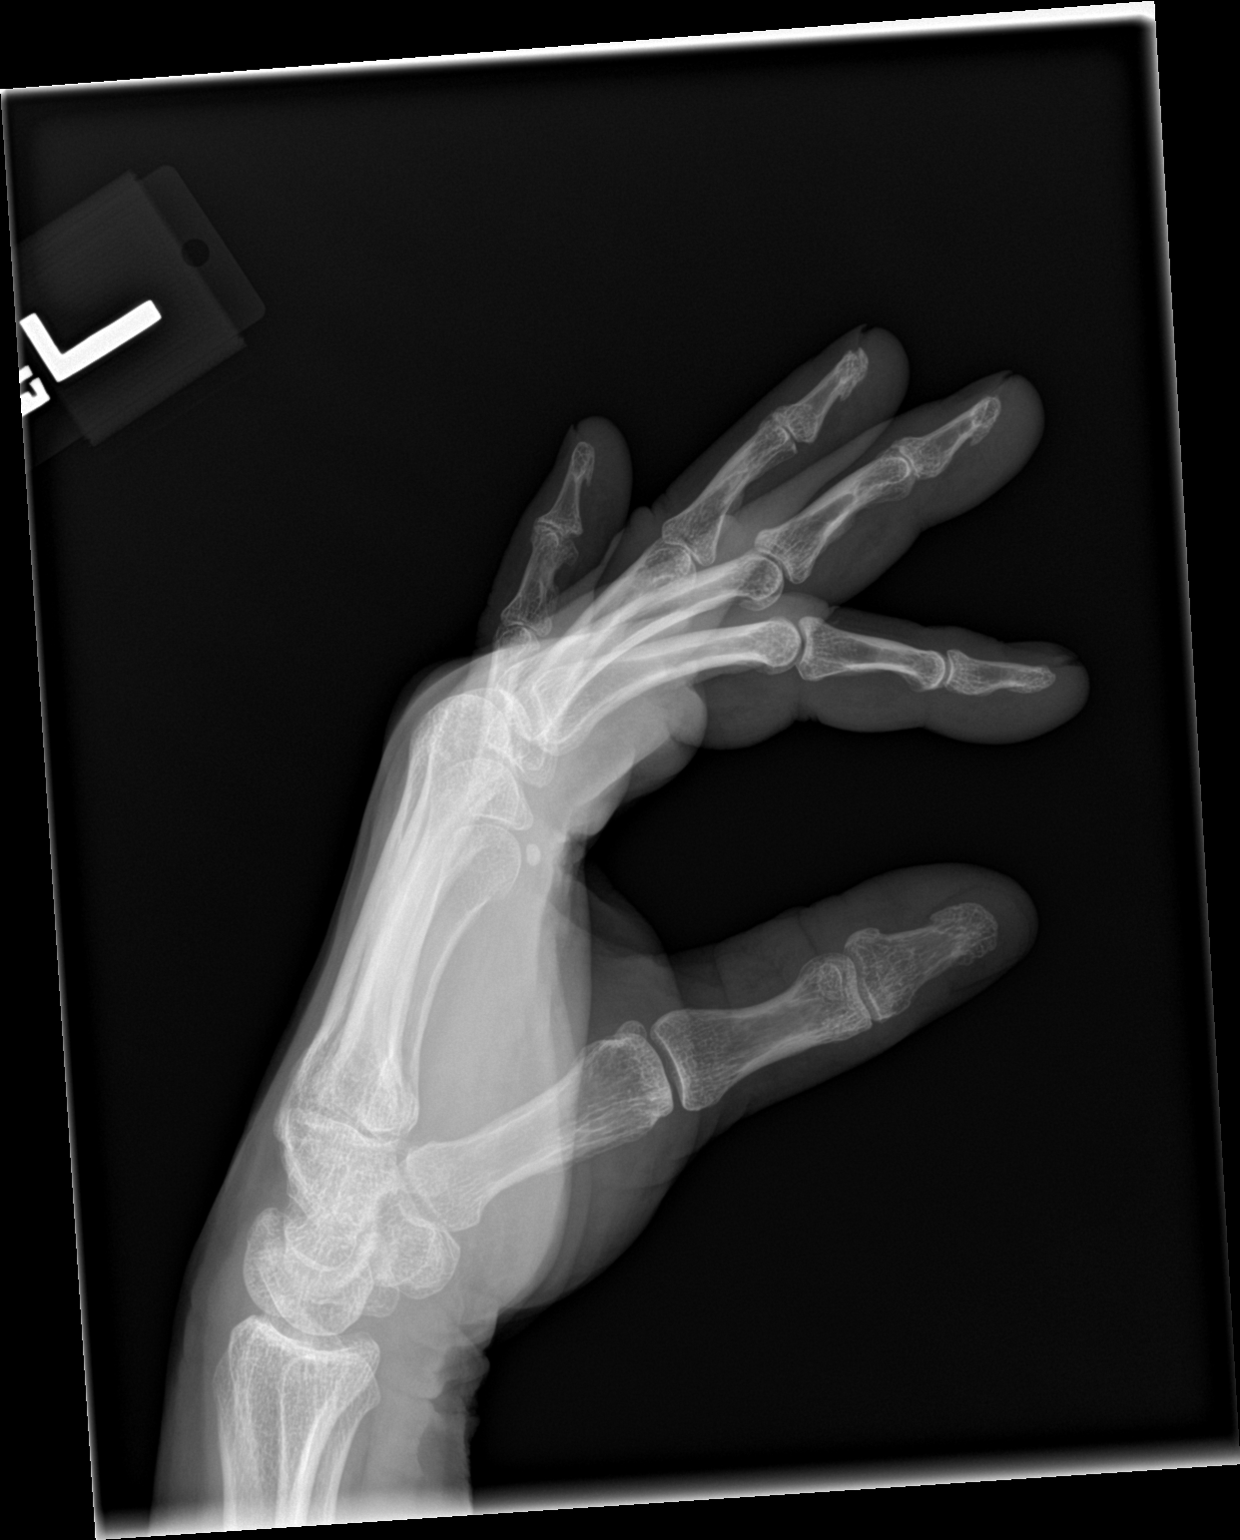

[3 of 3 positions shown; findings below may reference images not displayed]

FINDINGS: Left hand: No acute fracture or dislocation. Mild third through
fifth DIP joint space narrowing. Bone mineralization is normal. Soft
tissues are unremarkable. No radiopaque foreign body identified.

Right hand: No acute fracture or dislocation. Mild third through
fifth DIP joint space narrowing. Bone mineralization is normal. Soft
tissues are unremarkable. No radiopaque foreign body identified.
IMPRESSION: 1. No acute osseous abnormality.  No radiopaque foreign body.
2. Mild osteoarthritis of the bilateral third through fifth DIP
joints.

## 2020-10-17 ENCOUNTER — Other Ambulatory Visit: Payer: Self-pay

## 2020-10-17 DIAGNOSIS — Z20822 Contact with and (suspected) exposure to covid-19: Secondary | ICD-10-CM

## 2020-10-18 LAB — SPECIMEN STATUS REPORT

## 2020-10-18 LAB — SARS-COV-2, NAA 2 DAY TAT

## 2020-10-18 LAB — NOVEL CORONAVIRUS, NAA: SARS-CoV-2, NAA: NOT DETECTED

## 2020-10-21 ENCOUNTER — Other Ambulatory Visit: Payer: Self-pay

## 2020-10-21 DIAGNOSIS — Z20822 Contact with and (suspected) exposure to covid-19: Secondary | ICD-10-CM

## 2020-10-22 LAB — SARS-COV-2, NAA 2 DAY TAT

## 2020-10-22 LAB — NOVEL CORONAVIRUS, NAA: SARS-CoV-2, NAA: NOT DETECTED

## 2021-05-09 ENCOUNTER — Other Ambulatory Visit: Payer: Self-pay | Admitting: Obstetrics & Gynecology

## 2021-06-12 ENCOUNTER — Ambulatory Visit (INDEPENDENT_AMBULATORY_CARE_PROVIDER_SITE_OTHER): Payer: 59 | Admitting: Obstetrics & Gynecology

## 2021-06-12 ENCOUNTER — Other Ambulatory Visit: Payer: Self-pay

## 2021-06-12 ENCOUNTER — Encounter: Payer: Self-pay | Admitting: Obstetrics & Gynecology

## 2021-06-12 ENCOUNTER — Other Ambulatory Visit (HOSPITAL_COMMUNITY)
Admission: RE | Admit: 2021-06-12 | Discharge: 2021-06-12 | Disposition: A | Discharge status: Home / Self Care | Payer: 59 | Source: Ambulatory Visit | Attending: Obstetrics & Gynecology | Admitting: Obstetrics & Gynecology

## 2021-06-12 VITALS — BP 126/73 | HR 84 | Ht 61.0 in | Wt 140.0 lb

## 2021-06-12 DIAGNOSIS — Z1211 Encounter for screening for malignant neoplasm of colon: Secondary | ICD-10-CM

## 2021-06-12 DIAGNOSIS — Z01419 Encounter for gynecological examination (general) (routine) without abnormal findings: Secondary | ICD-10-CM

## 2021-06-12 DIAGNOSIS — Z1212 Encounter for screening for malignant neoplasm of rectum: Secondary | ICD-10-CM

## 2021-06-12 DIAGNOSIS — Z1231 Encounter for screening mammogram for malignant neoplasm of breast: Secondary | ICD-10-CM

## 2021-06-12 NOTE — Progress Notes (Signed)
Chief Complaint  Patient presents with   Gynecologic Exam    Pap only      55 y.o. [id number] No LMP recorded. Patient is postmenopausal. The current method of family planning is post menopausal status.  Outpatient Encounter Medications as of 06/12/2021  Medication Sig   acetaminophen (TYLENOL) 500 MG tablet Take 500 mg by mouth every 6 (six) hours as needed.   fexofenadine (ALLEGRA) 180 MG tablet Take 180 mg by mouth daily.   meloxicam (MOBIC) 15 MG tablet Take 1 tablet by mouth daily.   Multiple Vitamins-Minerals (MULTIVITAMIN WITH MINERALS) tablet Take 1 tablet by mouth daily.   triamcinolone cream (KENALOG) 0.1 % SMARTSIG:Sparingly Topical Twice Daily (Patient not taking: Reported on 06/12/2021)   No facility-administered encounter medications on file as of 06/12/2021.    Subjective Pt without complaints No pap for years No mammogram for years  Pap done today  Mammogram is scheduled History reviewed. No pertinent past medical history.  Past Surgical History:  Procedure Laterality Date   KNEE SURGERY     TUBAL LIGATION      OB History     Gravida  2   Para  2   Term  2   Preterm      AB      Living  2      SAB      IAB      Ectopic      Multiple      Live Births              No Known Allergies  Social History   Socioeconomic History   Marital status: Married    Spouse name: Not on file   Number of children: Not on file   Years of education: Not on file   Highest education level: Not on file  Occupational History   Not on file  Tobacco Use   Smoking status: Heavy Smoker    Packs/day: 0.50    Pack years: 0.00    Types: Cigarettes   Smokeless tobacco: Never  Vaping Use   Vaping Use: Never used  Substance and Sexual Activity   Alcohol use: Never   Drug use: Never   Sexual activity: Not Currently    Birth control/protection: Post-menopausal, Surgical  Other Topics Concern   Not on file  Social History Narrative   Not on  file   Social Determinants of Health   Financial Resource Strain: Low Risk    Difficulty of Paying Living Expenses: Not very hard  Food Insecurity: Food Insecurity Present   Worried About Running Out of Food in the Last Year: Never true   Ran Out of Food in the Last Year: Sometimes true  Transportation Needs: No Transportation Needs   Lack of Transportation (Medical): No   Lack of Transportation (Non-Medical): No  Physical Activity: Unknown   Days of Exercise per Week: Patient refused   Minutes of Exercise per Session: Patient refused  Stress: Stress Concern Present   Feeling of Stress : Rather much  Social Connections: Unknown   Frequency of Communication with Friends and Family: More than three times a week   Frequency of Social Gatherings with Friends and Family: Twice a week   Attends Religious Services: Patient refused   Database administrator or Organizations: Patient refused   Attends Banker Meetings: Patient refused   Marital Status: Married    Family History  Problem Relation Age of Onset  Seizures Father    Cerebral aneurysm Father    Hyperlipidemia Mother     Medications:       Current Outpatient Medications:    acetaminophen (TYLENOL) 500 MG tablet, Take 500 mg by mouth every 6 (six) hours as needed., Disp: , Rfl:    fexofenadine (ALLEGRA) 180 MG tablet, Take 180 mg by mouth daily., Disp: , Rfl:    meloxicam (MOBIC) 15 MG tablet, Take 1 tablet by mouth daily., Disp: , Rfl:    Multiple Vitamins-Minerals (MULTIVITAMIN WITH MINERALS) tablet, Take 1 tablet by mouth daily., Disp: , Rfl:    triamcinolone cream (KENALOG) 0.1 %, SMARTSIG:Sparingly Topical Twice Daily (Patient not taking: Reported on 06/12/2021), Disp: , Rfl:   Objective Blood pressure 126/73, pulse 84, height 5\' 1"  (1.549 m), weight 140 lb (63.5 kg).  General WDWN female NAD Vulva:  normal appearing vulva with no masses, tenderness or lesions Vagina:  normal mucosa, no  discharge Cervix:  Normal no lesions Uterus:  normal size, contour, position, consistency, mobility, non-tender Adnexa: ovaries:present,  normal adnexa in size, nontender and no masses   Pertinent ROS No burning with urination, frequency or urgency No nausea, vomiting or diarrhea Nor fever chills or other constitutional symptoms   Labs or studies No new    Impression Diagnoses this Encounter::   ICD-10-CM   1. Well woman exam with routine gynecological exam  Z01.419     2. Encounter for gynecological examination with Papanicolaou smear of cervix  Z01.419 Cytology - PAP( [facility])    3. Breast cancer screening by mammogram  Z12.31 MM 3D SCREEN BREAST BILATERAL    CANCELED: MM 3D SCREEN BREAST BILATERAL    CANCELED: MM 3D SCREEN BREAST BILATERAL      Established relevant diagnosis(es):   Plan/Recommendations: No orders of the defined types were placed in this encounter.   Labs or Scans Ordered: Orders Placed This Encounter  Procedures   MM 3D SCREEN BREAST BILATERAL    Management:: Pap done Mammogram scheduled Hemoccult negative  Follow up Return in about 3 years (around 06/12/2024) for yearly.      All questions were answered.

## 2021-06-15 ENCOUNTER — Ambulatory Visit (HOSPITAL_COMMUNITY)
Admission: RE | Admit: 2021-06-15 | Discharge: 2021-06-15 | Disposition: A | Discharge status: Home / Self Care | Payer: 59 | Source: Ambulatory Visit | Attending: Obstetrics & Gynecology | Admitting: Obstetrics & Gynecology

## 2021-06-15 ENCOUNTER — Other Ambulatory Visit: Payer: Self-pay

## 2021-06-15 DIAGNOSIS — Z1231 Encounter for screening mammogram for malignant neoplasm of breast: Secondary | ICD-10-CM | POA: Diagnosis not present

## 2021-06-15 LAB — CYTOLOGY - PAP
Adequacy: ABSENT
Comment: NEGATIVE
Diagnosis: NEGATIVE
High risk HPV: NEGATIVE

## 2021-06-19 ENCOUNTER — Other Ambulatory Visit (HOSPITAL_COMMUNITY): Payer: Self-pay | Admitting: Obstetrics & Gynecology

## 2021-06-19 DIAGNOSIS — R928 Other abnormal and inconclusive findings on diagnostic imaging of breast: Secondary | ICD-10-CM

## 2021-07-04 ENCOUNTER — Ambulatory Visit (HOSPITAL_COMMUNITY)
Admission: RE | Admit: 2021-07-04 | Discharge: 2021-07-04 | Disposition: A | Discharge status: Home / Self Care | Payer: 59 | Source: Ambulatory Visit | Attending: Obstetrics & Gynecology | Admitting: Obstetrics & Gynecology

## 2021-07-04 ENCOUNTER — Other Ambulatory Visit: Payer: Self-pay

## 2021-07-04 DIAGNOSIS — R928 Other abnormal and inconclusive findings on diagnostic imaging of breast: Secondary | ICD-10-CM | POA: Insufficient documentation

## 2021-07-11 ENCOUNTER — Other Ambulatory Visit: Payer: Self-pay

## 2021-07-11 ENCOUNTER — Encounter: Payer: Self-pay | Admitting: Orthopedic Surgery

## 2021-07-11 ENCOUNTER — Ambulatory Visit: Payer: 59 | Admitting: Orthopedic Surgery

## 2021-07-11 VITALS — BP 143/77 | HR 87 | Ht 61.0 in | Wt 140.0 lb

## 2021-07-11 DIAGNOSIS — M7711 Lateral epicondylitis, right elbow: Secondary | ICD-10-CM | POA: Diagnosis not present

## 2021-07-11 NOTE — Progress Notes (Signed)
New Patient Visit  Assessment: Madison Gregory is a 55 y.o. female with the following: 1. Lateral epicondylitis, right elbow  Plan: Patient has pain over the lateral epicondyle on the right arm.  Pain is worsened with palpation, as well as resisted wrist extension and long finger extension.  This is most consistent with lateral epicondylitis.  She has tried NSAIDs with limited improvement in her symptoms.  We discussed multiple options in clinic today, and she has been given a counterforce brace for the right forearm, as well as some exercises specifically for her elbow.  If she does not have resolution of her symptoms with these measures, we could consider an injection.  She is not interested in an injection today.  We also discussed using ice, and or Voltaren gel at the site of maximal tenderness.  She is aware that this condition rarely requires surgery.   Follow-up: No follow-ups on file.  Subjective:  Chief Complaint  Patient presents with   Arm Pain    Right forearm/elbow pain for 6-7 mos. NKI but she does lift some heavy boxes at work.    History of Present Illness: Madison Gregory is a 55 y.o. female who has been referred to clinic today by Catarina Hartshorn, FNP for evaluation of right elbow pain.  She is been having pain in the right elbow for approximately 6-7 months.  She denies a specific injury.  She notes repetitive motions at work.  Pain has been progressively worsening.  Pain is in the lateral aspect of the elbow, radiating distally.  She has tried Tylenol and meloxicam without resolution of her symptoms.  He has not tried a brace.  No physical therapy.  She is never had an injection.   Review of Systems: No fevers or chills No numbness or tingling No chest pain No shortness of breath No bowel or bladder dysfunction No GI distress No headaches   Medical History:  No past medical history on file. Hyperlipidemia Seasonal allergies  Past Surgical History:  Procedure  Laterality Date   KNEE SURGERY     TUBAL LIGATION      Family History  Problem Relation Age of Onset   Seizures Father    Cerebral aneurysm Father    Hyperlipidemia Mother    Social History   Tobacco Use   Smoking status: Heavy Smoker    Packs/day: 0.50    Pack years: 0.00    Types: Cigarettes   Smokeless tobacco: Never  Vaping Use   Vaping Use: Never used  Substance Use Topics   Alcohol use: Never   Drug use: Never    No Known Allergies  Current Meds  Medication Sig   acetaminophen (TYLENOL) 500 MG tablet Take 500 mg by mouth every 6 (six) hours as needed.   atorvastatin (LIPITOR) 10 MG tablet Take 10 mg by mouth daily.   fexofenadine (ALLEGRA) 180 MG tablet Take 180 mg by mouth daily.   meloxicam (MOBIC) 15 MG tablet Take 1 tablet by mouth daily.   Multiple Vitamins-Minerals (MULTIVITAMIN WITH MINERALS) tablet Take 1 tablet by mouth daily.    Objective: BP (!) 143/77   Pulse 87   Ht 5\' 1"  (1.549 m)   Wt 140 lb (63.5 kg)   BMI 26.45 kg/m   Physical Exam:  General: Alert and oriented. and No acute distress. Gait: Normal gait.  Evaluation of the right elbow demonstrates no deformity.  No atrophy is appreciated.  There is no ecchymosis or erythema.  She has  tenderness to palpation of the lateral epicondyle.  Tenderness within the extensor musculature.  Pain with resisted wrist extension.  Pain with resisted long finger extension.  Sensation is intact distally.  Fingers are warm and well-perfused.  IMAGING: No new imaging obtained today   New Medications:  No orders of the defined types were placed in this encounter.     Oliver Barre, MD  07/12/2021 8:03 AM

## 2021-07-12 ENCOUNTER — Encounter: Payer: Self-pay | Admitting: Orthopedic Surgery

## 2022-05-23 DIAGNOSIS — Z0000 Encounter for general adult medical examination without abnormal findings: Secondary | ICD-10-CM | POA: Diagnosis not present

## 2022-05-23 DIAGNOSIS — E785 Hyperlipidemia, unspecified: Secondary | ICD-10-CM | POA: Diagnosis not present

## 2023-02-28 ENCOUNTER — Encounter: Payer: Self-pay | Admitting: Radiology

## 2023-08-14 DIAGNOSIS — E7849 Other hyperlipidemia: Secondary | ICD-10-CM | POA: Diagnosis not present

## 2023-08-14 DIAGNOSIS — Z79899 Other long term (current) drug therapy: Secondary | ICD-10-CM | POA: Diagnosis not present

## 2024-01-29 ENCOUNTER — Other Ambulatory Visit (HOSPITAL_COMMUNITY): Payer: Self-pay | Admitting: Adult Health

## 2024-01-29 DIAGNOSIS — Z1231 Encounter for screening mammogram for malignant neoplasm of breast: Secondary | ICD-10-CM

## 2024-02-18 DIAGNOSIS — M1990 Unspecified osteoarthritis, unspecified site: Secondary | ICD-10-CM | POA: Diagnosis not present

## 2024-02-18 DIAGNOSIS — E7849 Other hyperlipidemia: Secondary | ICD-10-CM | POA: Diagnosis not present

## 2024-02-18 DIAGNOSIS — Z6827 Body mass index (BMI) 27.0-27.9, adult: Secondary | ICD-10-CM | POA: Diagnosis not present

## 2024-02-25 ENCOUNTER — Other Ambulatory Visit (HOSPITAL_COMMUNITY): Payer: Self-pay | Admitting: Adult Health

## 2024-02-25 DIAGNOSIS — R928 Other abnormal and inconclusive findings on diagnostic imaging of breast: Secondary | ICD-10-CM

## 2024-03-31 ENCOUNTER — Ambulatory Visit (HOSPITAL_COMMUNITY): Payer: Self-pay

## 2024-03-31 ENCOUNTER — Ambulatory Visit (HOSPITAL_COMMUNITY): Payer: Self-pay | Attending: Adult Health

## 2024-03-31 ENCOUNTER — Encounter (HOSPITAL_COMMUNITY): Payer: Self-pay

## 2024-05-18 DIAGNOSIS — E119 Type 2 diabetes mellitus without complications: Secondary | ICD-10-CM | POA: Diagnosis not present

## 2024-05-18 DIAGNOSIS — Z79899 Other long term (current) drug therapy: Secondary | ICD-10-CM | POA: Diagnosis not present

## 2024-05-18 DIAGNOSIS — E7849 Other hyperlipidemia: Secondary | ICD-10-CM | POA: Diagnosis not present

## 2024-08-17 DIAGNOSIS — E119 Type 2 diabetes mellitus without complications: Secondary | ICD-10-CM | POA: Diagnosis not present

## 2024-08-17 DIAGNOSIS — Z79899 Other long term (current) drug therapy: Secondary | ICD-10-CM | POA: Diagnosis not present

## 2024-08-17 DIAGNOSIS — E7849 Other hyperlipidemia: Secondary | ICD-10-CM | POA: Diagnosis not present
